# Patient Record
Sex: Female | Born: 1945 | Race: White | Hispanic: No | Marital: Married | Smoking: Never smoker
Health system: Southern US, Community
[De-identification: ages and names within clinical notes are randomized; demographics above are authoritative.]

## PROBLEM LIST (undated history)

## (undated) DIAGNOSIS — E785 Hyperlipidemia, unspecified: Secondary | ICD-10-CM

## (undated) DIAGNOSIS — R011 Cardiac murmur, unspecified: Secondary | ICD-10-CM

## (undated) HISTORY — PX: TONSILLECTOMY: SUR1361

## (undated) HISTORY — DX: Hyperlipidemia, unspecified: E78.5

## (undated) HISTORY — PX: CARPAL TUNNEL RELEASE: SHX101

## (undated) HISTORY — PX: ABDOMINAL HYSTERECTOMY: SHX81

## (undated) HISTORY — DX: Cardiac murmur, unspecified: R01.1

---

## 1998-10-19 HISTORY — PX: FINGER AMPUTATION: SHX636

## 2016-10-21 ENCOUNTER — Emergency Department (HOSPITAL_COMMUNITY)
Admission: EM | Admit: 2016-10-21 | Discharge: 2016-10-21 | Disposition: A | Discharge status: Home / Self Care | Payer: Medicare Other | Attending: Emergency Medicine | Admitting: Emergency Medicine

## 2016-10-21 ENCOUNTER — Emergency Department (HOSPITAL_COMMUNITY): Payer: Medicare Other

## 2016-10-21 ENCOUNTER — Ambulatory Visit (HOSPITAL_COMMUNITY)
Admission: EM | Admit: 2016-10-21 | Discharge: 2016-10-21 | Disposition: A | Discharge status: Home / Self Care | Payer: Medicare Other | Attending: Emergency Medicine | Admitting: Emergency Medicine

## 2016-10-21 ENCOUNTER — Encounter (HOSPITAL_COMMUNITY): Payer: Self-pay | Admitting: Emergency Medicine

## 2016-10-21 ENCOUNTER — Encounter (HOSPITAL_COMMUNITY): Payer: Self-pay | Admitting: *Deleted

## 2016-10-21 DIAGNOSIS — M542 Cervicalgia: Secondary | ICD-10-CM | POA: Diagnosis present

## 2016-10-21 DIAGNOSIS — Z7982 Long term (current) use of aspirin: Secondary | ICD-10-CM | POA: Diagnosis not present

## 2016-10-21 DIAGNOSIS — R0789 Other chest pain: Secondary | ICD-10-CM | POA: Diagnosis not present

## 2016-10-21 LAB — BASIC METABOLIC PANEL
ANION GAP: 8 (ref 5–15)
BUN: 14 mg/dL (ref 6–20)
CO2: 26 mmol/L (ref 22–32)
Calcium: 9.7 mg/dL (ref 8.9–10.3)
Chloride: 103 mmol/L (ref 101–111)
Creatinine, Ser: 0.82 mg/dL (ref 0.44–1.00)
GFR calc Af Amer: >60 mL/min (ref >60)
GFR calc non Af Amer: >60 mL/min (ref >60)
GLUCOSE: 86 mg/dL (ref 65–99)
POTASSIUM: 4 mmol/L (ref 3.5–5.1)
Sodium: 137 mmol/L (ref 135–145)

## 2016-10-21 LAB — I-STAT TROPONIN, ED
Troponin i, poc: 0.01 ng/mL (ref 0.00–0.08)
Troponin i, poc: 0 ng/mL (ref 0.00–0.08)

## 2016-10-21 LAB — CBC
HEMATOCRIT: 42.2 % (ref 36.0–46.0)
HEMOGLOBIN: 14.4 g/dL (ref 12.0–15.0)
MCH: 30.3 pg (ref 26.0–34.0)
MCHC: 34.1 g/dL (ref 30.0–36.0)
MCV: 88.8 fL (ref 78.0–100.0)
Platelets: 246 10*3/uL (ref 150–400)
RBC: 4.75 MIL/uL (ref 3.87–5.11)
RDW: 13 % (ref 11.5–15.5)
WBC: 6.1 10*3/uL (ref 4.0–10.5)

## 2016-10-21 MED ORDER — SODIUM CHLORIDE 0.9 % IV BOLUS (SEPSIS)
1000.0000 mL | Freq: Once | INTRAVENOUS | Status: AC
  Administered 2016-10-21: 1000 mL via INTRAVENOUS

## 2016-10-21 MED ORDER — IOPAMIDOL (ISOVUE-370) INJECTION 76%
INTRAVENOUS | Status: AC
  Administered 2016-10-21: 50 mL
  Filled 2016-10-21: qty 50

## 2016-10-21 MED ORDER — ASPIRIN 325 MG PO TBEC: 325.0000 mg | DELAYED_RELEASE_TABLET | Freq: Every day | ORAL | Status: AC

## 2016-10-21 NOTE — Discharge Instructions (Signed)
Increase your aspirin dose to full strength dose (325 mg) daily.   If you begin to develop any focal weakness, numbness, or other concerning signs or symptoms -- return to nearest ER immediately.   Follow-up with your primary doctor back home within the next 1-2 weeks for re-evaluation.

## 2016-10-21 NOTE — ED Triage Notes (Signed)
Pt  Reports   l  Upper   Back   And   Neck    Pain      X  3  Days   Has  Had  Similar          Had  Chest  Pain  Last     Pm    X  1 5     mins          denys  Any      Chest  Pain         Neck  Pain    Now      Pain  Comes   And  Goes

## 2016-10-21 NOTE — ED Provider Notes (Signed)
CSN: [id number]     Arrival date & time 10/21/16  1231 History   First MD Initiated Contact with Patient 10/21/16 1410     Chief Complaint  Patient presents with  . Back Pain   (Consider location/radiation/quality/duration/timing/severity/associated sxs/prior Treatment) Patient presents with c/o left posterior neck pain that is severe.  She also is having chest pain.  [facility] she developed sudden onset of posterior neck pain and when she was lying down last night she developed chest pain that comes and goes.  She is having neck pain that is severe in pain and then diminishes to lower level of pain.     The history is provided by the patient.  Back Pain  Location:  Generalized Quality:  Aching Radiates to:  L shoulder Pain severity:  Severe Pain is:  Same all the time Onset quality:  Sudden Duration:  1 day Timing:  Constant Progression:  Waxing and waning Chronicity:  New Relieved by:  Nothing Worsened by:  Nothing Ineffective treatments:  None tried   History reviewed. No pertinent past medical history. History reviewed. No pertinent surgical history. History reviewed. No pertinent family history. Social History  Substance Use Topics  . Smoking status: Never Smoker  . Smokeless tobacco: Never Used  . Alcohol use No   OB History    No data available     Review of Systems  Constitutional: Negative.   HENT: Negative.   Eyes: Negative.   Respiratory: Negative.   Cardiovascular: Negative.   Gastrointestinal: Negative.   Endocrine: Negative.   Genitourinary: Negative.   Musculoskeletal: Positive for back pain.  Skin: Negative.   Allergic/Immunologic: Negative.   Neurological: Negative.   Hematological: Negative.   Psychiatric/Behavioral: Negative.     Allergies  Codeine  Home Medications   Prior to Admission medications   Not on File   Meds Ordered and Administered this Visit  Medications - No data to  display  BP 118/67   Pulse 76   Temp 98.2 F (36.8 C) (Oral)   Resp 18   SpO2 100%  No data found.   Physical Exam  Constitutional: She is oriented to person, place, and time. She appears well-developed and well-nourished.  HENT:  Head: Normocephalic and atraumatic.  Eyes: Conjunctivae and EOM are normal. Pupils are equal, round, and reactive to light.  Neck: Normal range of motion. Neck supple.  Cardiovascular: Normal rate, regular rhythm and normal heart sounds.   Pulmonary/Chest: Effort normal and breath sounds normal.  Abdominal: Soft. Bowel sounds are normal.  Musculoskeletal: Normal range of motion.  Neurological: She is alert and oriented to person, place, and time.  Nursing note and vitals reviewed.   Urgent Care Course   Clinical Course     Procedures (including critical care time)  Labs Review Labs Reviewed - No data to display  Imaging Review No results found.   Visual Acuity Review  Right Eye Distance:   Left Eye Distance:   Bilateral Distance:    Right Eye Near:   Left Eye Near:    Bilateral Near:         MDM   1. Neck pain   2. Other chest pain    Transfer to ED to rule out cardiac etiology or concerns for possible dissecting vertebral arteries.       Deatra CanterWilliam J Korbyn Chopin, FNP 10/21/16 [phone]

## 2016-10-21 NOTE — ED Triage Notes (Signed)
[facility], left shoulder and CP intermittent x 3 days

## 2016-10-21 NOTE — ED Provider Notes (Signed)
[facility]-EMERGENCY DEPT Provider Note   CSN: [id number] Arrival date & time: 10/21/16  1515     History   Chief Complaint Chief Complaint  Patient presents with  . Neck Pain  . Chest Pain    HPI Claire Dominguez is a 71 y.o. female.  The history is provided by the patient and a relative. No language interpreter was used.     71 year old female with a past medical history of hyperlipidemia presenting today with neck pain starting this morning and chest pain starting this morning. Patient states that her neck pain is on the left side of her posterior neck. She describes it as sharp and radiating down towards the shoulder. Denies any prior similar symptoms. Denies any known injury but does state she rode a bicycle for 5 miles yesterday which she typically does on a regular basis. Patient also states that around 4 AM this morning she had 10-15 minutes of sharp squeezing chest pain. States this resolved on its own. States she has had twinges of chest pain since then but none as bad as the initial episode. States that her neck pain waxes and wanes. Currently the pain is tolerable. Was initially seen at urgent care and sent here due to concern for possible vertebral dissection or cardiac etiology. Movement does not worsen her symptoms. No alleviating symptoms.  History reviewed. No pertinent past medical history.  There are no active problems to display for this patient.   History reviewed. No pertinent surgical history.  OB History    No data available       Home Medications    Prior to Admission medications   Medication Sig Start Date End Date Taking? Authorizing Provider  aspirin EC 81 MG tablet Take 81 mg by mouth daily.   Yes Historical Provider, MD  Multiple Vitamin (MULTIVITAMIN WITH MINERALS) TABS tablet Take 1 tablet by mouth daily.   Yes Historical Provider, MD    Family History History reviewed. No pertinent family history.  Social History Social History  Substance Use  Topics  . Smoking status: Never Smoker  . Smokeless tobacco: Never Used  . Alcohol use No     Allergies   Bactrim [sulfamethoxazole-trimethoprim] and Codeine   Review of Systems Review of Systems  Constitutional: Negative for chills and fever.  HENT: Negative for ear pain and sore throat.   Eyes: Negative for pain and visual disturbance.  Respiratory: Negative for cough and shortness of breath.   Cardiovascular: Positive for chest pain (as described in HPI). Negative for palpitations.  Gastrointestinal: Positive for nausea (yesterday). Negative for abdominal pain and vomiting.  Genitourinary: Negative for dysuria and hematuria.  Musculoskeletal: Positive for neck pain (as described in HPI). Negative for arthralgias and back pain.  Skin: Negative for color change and rash.  Neurological: Negative for seizures, syncope, weakness and numbness.  All other systems reviewed and are negative.    Physical Exam Updated Vital Signs BP 113/55   Pulse 82   Temp 98 F (36.7 C) (Oral)   Resp 13   SpO2 95%   Physical Exam  Constitutional: She is oriented to person, place, and time. She appears well-developed and well-nourished. No distress.  HENT:  Head: Normocephalic and atraumatic.  Eyes: Conjunctivae and EOM are normal. Pupils are equal, round, and reactive to light.  Neck: Normal range of motion. Neck supple. No spinous process tenderness and no muscular tenderness present. Normal range of motion present.  Cardiovascular: Normal rate and regular rhythm.   Murmur (grade  II systolic murmur) heard. Pulmonary/Chest: Effort normal and breath sounds normal. No respiratory distress.  Abdominal: Soft. There is no tenderness.  Musculoskeletal: She exhibits no edema.  Neurological: She is alert and oriented to person, place, and time. She has normal strength. No cranial nerve deficit (CN II-XII intact) or sensory deficit. Coordination (nml finger to nose bilaterally) normal. GCS eye  subscore is 4. GCS verbal subscore is 5. GCS motor subscore is 6.  Skin: Skin is warm and dry.  Psychiatric: She has a normal mood and affect.  Nursing note and vitals reviewed.    ED Treatments / Results  Labs (all labs ordered are listed, but only abnormal results are displayed) Labs Reviewed  BASIC METABOLIC PANEL  CBC  I-STAT TROPOININ, ED  I-STAT TROPOININ, ED    EKG  EKG Interpretation None       Radiology Ct Angio Head W Or Wo Contrast  Result Date: 10/21/2016 CLINICAL DATA:  Initial evaluation for severe left neck pain. EXAM: CT ANGIOGRAPHY HEAD AND NECK TECHNIQUE: Multidetector CT imaging of the head and neck was performed using the standard protocol during bolus administration of intravenous contrast. Multiplanar CT image reconstructions and MIPs were obtained to evaluate the vascular anatomy. Carotid stenosis measurements (when applicable) are obtained utilizing NASCET criteria, using the distal internal carotid diameter as the denominator. CONTRAST:  50 cc of Isovue 370. COMPARISON:  None. FINDINGS: CT HEAD FINDINGS Brain: Mild age-related cerebral atrophy with chronic microvascular ischemic disease. No acute intracranial hemorrhage. No evidence for acute large vessel territory infarct. No mass lesion, midline shift or mass effect. No hydrocephalus. No extra-axial fluid collection. Vascular: No hyperdense vessel. Skull: Scalp soft tissues within normal limits.  Calvarium intact. Sinuses: Paranasal sinuses are clear.  No mastoid effusion. Orbits: Globes and orbital soft tissues within normal limits. Review of the MIP images confirms the above findings CTA NECK FINDINGS Aortic arch: Aortic arch of normal caliber with normal branch morphology. No high-grade stenosis at the origin of the great vessels. Minimal plaque within the arch itself. Visualized subclavian arteries widely patent. Right carotid system: Right common carotid artery patent from its origin to the bifurcation.  Minimal centric plaque about the proximal right ICA without stenosis. Right ICA patent distally to the skullbase without stenosis, dissection, or occlusion. Left carotid system: Left common carotid artery patent from its origin to the bifurcation. A centric calcified plaque about the left bifurcation/ proximal left ICA. No significant flow-limiting stenosis. Distally, the left ICA demonstrates multifocal irregularity, concerning for possible non flow limiting dissection (series 8, image 115). Area of involvement measures approximately 15 mm in length. No raised intimal flap, intraluminal thrombus, or significant stenosis identified. While FMD could conceivably have this appearance, finding is concerning for dissection given the patient's symptoms and isolated nature of this finding. Vertebral arteries: Both vertebral arteries arise from the subclavian arteries. Vertebral arteries widely patent within the neck without stenosis, dissection, or occlusion. Minimal irregularity within the distal V2 segments favored to be artifactual in nature on this exam. Skeleton: No acute osseous abnormality. No worrisome lytic or blastic osseous lesions. Other neck: Soft tissues of the neck demonstrate no acute abnormality. 11 mm hypodense nodule noted within the right thyroid lobe, of doubtful clinical significance. Upper chest: Visualized upper mediastinum within normal limits. Visualized lungs are clear. Review of the MIP images confirms the above findings CTA HEAD FINDINGS Anterior circulation: Petrous segments patent bilaterally. Mild scattered plaque within the and cavernous ICAs without significant stenosis. Tiny 2 mm focal  outpouching arising from the cavernous right ICA felt to reflect date small infundibulum, with small arterial branch seen coursing from its apex (series 7, image 113). This is directed laterally. ICA termini widely patent. A1 segments patent. Anterior communicating artery normal. Anterior cerebral arteries  patent to their distal aspects. M1 segments widely patent without stenosis or occlusion. MCA bifurcations normal. No proximal M2 occlusion. Distal MCA branches well opacified and symmetric bilaterally. Posterior circulation: Vertebral arteries patent to the vertebrobasilar junction. Posterior inferior cerebral arteries patent bilaterally. Basilar artery widely patent. Superior cerebellar arteries patent bilaterally. Both of the posterior cerebral artery supplied via the basilar artery and are well opacified to their distal aspects. Venous sinuses: Patent. Anatomic variants: No significant anatomic variant. No discrete aneurysm or vascular malformation. Delayed phase: No pathologic enhancement. Review of the MIP images confirms the above findings IMPRESSION: 1. 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 No raised intimal flap, intraluminal thrombus, or significant stenosis identified. While FMD could could conceivably have this appearance, this is less favored given the patient's symptoms and isolated nature of this finding. 2. No other acute vascular abnormality within the major arterial vasculature of the head and neck. 3. Atheromatous plaque about the carotid bifurcations without significant stenosis, left greater than right. Electronically Signed   By: Rise Mu M.D.   On: 10/21/2016 22:18   Dg Chest 2 View  Result Date: 10/21/2016 CLINICAL DATA:  Chest pain for 3 days EXAM: CHEST  2 VIEW COMPARISON:  None. FINDINGS: Lungs are clear. Heart size and pulmonary vascularity are normal. No adenopathy. There is atherosclerotic calcification in the aorta. There is upper thoracic dextroscoliosis with mild lower thoracic levoscoliosis. No pneumothorax. IMPRESSION: No edema or consolidation.  There is aortic atherosclerosis. Electronically Signed   By: Bretta Bang III M.D.   On: 10/21/2016 16:32   Ct Angio Neck W And/or Wo  Contrast  Result Date: 10/21/2016 CLINICAL DATA:  Initial evaluation for severe left neck pain. EXAM: CT ANGIOGRAPHY HEAD AND NECK TECHNIQUE: Multidetector CT imaging of the head and neck was performed using the standard protocol during bolus administration of intravenous contrast. Multiplanar CT image reconstructions and MIPs were obtained to evaluate the vascular anatomy. Carotid stenosis measurements (when applicable) are obtained utilizing NASCET criteria, using the distal internal carotid diameter as the denominator. CONTRAST:  50 cc of Isovue 370. COMPARISON:  None. FINDINGS: CT HEAD FINDINGS Brain: Mild age-related cerebral atrophy with chronic microvascular ischemic disease. No acute intracranial hemorrhage. No evidence for acute large vessel territory infarct. No mass lesion, midline shift or mass effect. No hydrocephalus. No extra-axial fluid collection. Vascular: No hyperdense vessel. Skull: Scalp soft tissues within normal limits.  Calvarium intact. Sinuses: Paranasal sinuses are clear.  No mastoid effusion. Orbits: Globes and orbital soft tissues within normal limits. Review of the MIP images confirms the above findings CTA NECK FINDINGS Aortic arch: Aortic arch of normal caliber with normal branch morphology. No high-grade stenosis at the origin of the great vessels. Minimal plaque within the arch itself. Visualized subclavian arteries widely patent. Right carotid system: Right common carotid artery patent from its origin to the bifurcation. Minimal centric plaque about the proximal right ICA without stenosis. Right ICA patent distally to the skullbase without stenosis, dissection, or occlusion. Left carotid system: Left common carotid artery patent from its origin to the bifurcation. A centric calcified plaque about the left bifurcation/ proximal left ICA. No significant flow-limiting stenosis. Distally, the left ICA demonstrates multifocal  irregularity, concerning for possible non flow limiting  dissection (series 8, image 115). Area of involvement measures approximately 15 mm in length. No raised intimal flap, intraluminal thrombus, or significant stenosis identified. While FMD could conceivably have this appearance, finding is concerning for dissection given the patient's symptoms and isolated nature of this finding. Vertebral arteries: Both vertebral arteries arise from the subclavian arteries. Vertebral arteries widely patent within the neck without stenosis, dissection, or occlusion. Minimal irregularity within the distal V2 segments favored to be artifactual in nature on this exam. Skeleton: No acute osseous abnormality. No worrisome lytic or blastic osseous lesions. Other neck: Soft tissues of the neck demonstrate no acute abnormality. 11 mm hypodense nodule noted within the right thyroid lobe, of doubtful clinical significance. Upper chest: Visualized upper mediastinum within normal limits. Visualized lungs are clear. Review of the MIP images confirms the above findings CTA HEAD FINDINGS Anterior circulation: Petrous segments patent bilaterally. Mild scattered plaque within the and cavernous ICAs without significant stenosis. Tiny 2 mm focal outpouching arising from the cavernous right ICA felt to reflect date small infundibulum, with small arterial branch seen coursing from its apex (series 7, image 113). This is directed laterally. ICA termini widely patent. A1 segments patent. Anterior communicating artery normal. Anterior cerebral arteries patent to their distal aspects. M1 segments widely patent without stenosis or occlusion. MCA bifurcations normal. No proximal M2 occlusion. Distal MCA branches well opacified and symmetric bilaterally. Posterior circulation: Vertebral arteries patent to the vertebrobasilar junction. Posterior inferior cerebral arteries patent bilaterally. Basilar artery widely patent. Superior cerebellar arteries patent bilaterally. Both of the posterior cerebral artery  supplied via the basilar artery and are well opacified to their distal aspects. Venous sinuses: Patent. Anatomic variants: No significant anatomic variant. No discrete aneurysm or vascular malformation. Delayed phase: No pathologic enhancement. Review of the MIP images confirms the above findings IMPRESSION: 1. 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 No raised intimal flap, intraluminal thrombus, or significant stenosis identified. While FMD could could conceivably have this appearance, this is less favored given the patient's symptoms and isolated nature of this finding. 2. No other acute vascular abnormality within the major arterial vasculature of the head and neck. 3. Atheromatous plaque about the carotid bifurcations without significant stenosis, left greater than right. Electronically Signed   By: Rise Mu M.D.   On: 10/21/2016 22:18    Procedures Procedures (including critical care time)  Medications Ordered in ED Medications  sodium chloride 0.9 % bolus 1,000 mL (1,000 mLs Intravenous New Bag/Given 10/21/16 2131)  iopamidol (ISOVUE-370) 76 % injection (50 mLs  Contrast Given 10/21/16 2103)     Initial Impression / Assessment and Plan / ED Course  I have reviewed the triage vital signs and the nursing notes.  Pertinent labs & imaging results that were available during my care of the patient were reviewed by me and considered in my medical decision making (see chart for details).  Clinical Course     71 year old female presents today with neck pain and chest pain today. On arrival, patient's pain is tolerable. She has no focal neurological deficits. No ischemic changes on EKG. Initial troponin is negative. Heart score is 3 as I have low suspicion given her description of the symptoms today for ACS.  Proceeded with CTA of the head and neck. On review of these imaging studies, patient has a area on the left  internal carotid artery that is irregular and could possibly be a  dissection. Neurology paged and we are currently awaiting recommendations at 11 PM.  2317: Discussed with neurologist Dr. Otelia LimesLindzen, at this point recommends continuing aspirin at home. Does not think this is related to pain. Would not put on anticoagulation at this point.     At this point, given negative troponin and imaging feel patient would be appropriate for discharge home on aspirin (med list reveals she is already on this). I discussed this plan with the patient who is agreeable. Patient discharged home in good condition.   Final Clinical Impressions(s) / ED Diagnoses   Final diagnoses:  Neck pain  Atypical chest pain    New Prescriptions New Prescriptions   No medications on file     Madolyn FriezeVijay Alsace Dowd, MD 10/21/16 [id number]    Madolyn FriezeVijay Kariss Longmire, MD 10/21/16 [id number]    Lavera Guiseana Duo Liu, MD 10/23/16 1303

## 2016-10-21 NOTE — ED Notes (Signed)
Patient transported to CT 

## 2016-11-06 ENCOUNTER — Ambulatory Visit (INDEPENDENT_AMBULATORY_CARE_PROVIDER_SITE_OTHER): Payer: Medicare Other | Admitting: Vascular Surgery

## 2016-11-06 ENCOUNTER — Encounter: Payer: Self-pay | Admitting: Vascular Surgery

## 2016-11-06 VITALS — BP 123/76 | HR 78 | Temp 97.7°F | Resp 18 | Ht 60.0 in | Wt 141.5 lb

## 2016-11-06 DIAGNOSIS — I739 Peripheral vascular disease, unspecified: Principal | ICD-10-CM

## 2016-11-06 DIAGNOSIS — I779 Disorder of arteries and arterioles, unspecified: Secondary | ICD-10-CM

## 2016-11-06 NOTE — Progress Notes (Signed)
New Carotid Patient  Referred by:  Dr. Docia Chuck  Reason for referral: left internal carotid artery dissection  History of Present Illness  Claire Dominguez is a 71 y.o. (December 04, 1945) female who presents with chief complaint: previous episode of neck pain.  On 10/20/16 this patient had onset of severe neck and then chest pain.  Neck pain was sharp in character with severe intensity.  This occurred in the setting of exercise at the [facility].  She then developed chest pain which had some element of radiation to shoulder.  She felt the neck pain improved with warm compress and the chest pain resolved.  The next morning she continued to have some neck pain, so she was referred to the urgent care center, which felt that she might have a dissection component so she was referred to the ED.  CTA neck was done there and read as an equivocal L ICA dissection.  This patient has no history of TIA or CVA sx.  Her risk factors for atherosclerosis include: HLD.  Past Medical History:  Diagnosis Date  . Heart murmur   . Hyperlipidemia     Past Surgical History:  Procedure Laterality Date  . ABDOMINAL HYSTERECTOMY    . CARPAL TUNNEL RELEASE Bilateral   . CESAREAN SECTION    . FINGER AMPUTATION Right 2000   third finger   . TONSILLECTOMY      Social History   Social History  . Marital status: Married    Spouse name: N/A  . Number of children: N/A  . Years of education: N/A   Occupational History  . Not on file.   Social History Main Topics  . Smoking status: Never Smoker  . Smokeless tobacco: Never Used  . Alcohol use No  . Drug use: No  . Sexual activity: Not on file   Other Topics Concern  . Not on file   Social History Narrative  . No narrative on file    Family History: patient is unable to detail the medical history of his parents   Current Outpatient Prescriptions  Medication Sig Dispense Refill  . aspirin EC 325 MG EC tablet Take 1 tablet (325 mg total) by mouth daily.    .  Multiple Vitamin (MULTIVITAMIN WITH MINERALS) TABS tablet Take 1 tablet by mouth daily.     No current facility-administered medications for this visit.     Allergies  Allergen Reactions  . Bactrim [Sulfamethoxazole-Trimethoprim]     unknown  . Codeine     Rooms spin around     REVIEW OF SYSTEMS:   Cardiac:  positive for: Chest pain or chest pressure, negative for: Shortness of breath upon exertion and Shortness of breath when lying flat,   Vascular:  positive for: no symptoms,  negative for: Pain in calf, thigh, or hip brought on by ambulation, Pain in feet at night that wakes you up from your sleep, Blood clot in your veins and Leg swelling  Pulmonary:  positive for: no symptoms,  negative for: Oxygen at home, Productive cough and Wheezing  Neurologic:  positive for: No symptoms, negative for: Sudden weakness in arms or legs, Sudden numbness in arms or legs, Sudden onset of difficulty speaking or slurred speech, Temporary loss of vision in one eye and Problems with dizziness  Gastrointestinal:  positive for: no symptoms, negative for: Blood in stool and Vomited blood  Genitourinary:  positive for: no symptoms, negative for: Burning when urinating and Blood in urine  Psychiatric:  positive for:  no symptoms,  negative for: Major depression  Hematologic:  positive for: no symptoms,  negative for: negative for: Bleeding problems and Problems with blood clotting too easily  Dermatologic:  positive for: Rashes or ulcers, negative for: skin cancer  Constitutional:  positive for: no symptoms, negative for: Fever or chills  Ear/Nose/Throat:  positive for: no symptoms, negative for: Change in hearing, Nose bleeds and Sore throat  Musculoskeletal:  positive for: no symptoms, negative for: Back pain, Joint pain and Muscle pain   For VQI Use Only  PRE-ADM LIVING: Home  AMB STATUS: Ambulatory  CAD Sx: None  PRIOR CHF: None  STRESS TEST: No   Physical  Examination  Vitals:   11/06/16 1411 11/06/16 1416  BP: 120/73 123/76  Pulse: 78   Resp: 18   Temp: 97.7 F (36.5 C)   TempSrc: Oral   SpO2: 99%   Weight: 141 lb 8 oz (64.2 kg)   Height: 5' (1.524 m)     Body mass index is 27.63 kg/m.  General: Alert, O x 3, WD,NAD  Head: [place]/AT,   Ear/Nose/Throat: Hearing grossly intact, nares without erythema or drainage, oropharynx without Erythema or Exudate , Mallampati score: 3, Dentition intact  Eyes: PERRLA, EOMI,   Neck: Supple, mid-line trachea,    Pulmonary: Sym exp, good B air movt,CTA B  Cardiac: RRR, Nl S1, S2, no Murmurs, No rubs, No S3,S4  Vascular: Vessel Right Left  Radial Palpable Palpable  Brachial Palpable Palpable  Carotid Palpable, No Bruit Palpable, No Bruit  Aorta Not palpable N/A  Femoral Palpable Palpable  Popliteal Not palpable Not palpable  PT Palpable Palpable  DP Palpable Palpable   Gastrointestinal: soft, non-distended, non-tender to palpation, No guarding or rebound, no HSM, no masses, no CVAT B, No palpable prominent aortic pulse,    Musculoskeletal: M/S 5/5 throughout, Extremities without ischemic changes  Neurologic: CN 2-12 intact, Pain and light touch intact in extremities, Motor exam as listed above  Psychiatric: Judgement intact , Mood & affect appropriate for pt's clinical situation  Dermatologic: See M/S exam for extremity exam, No rashes otherwise noted  Lymph : Palpable lymph nodes: None   CTA Neck (10/21/16) 1. 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 No raised intimal flap, intraluminal thrombus, or significant stenosis identified. While FMD could could conceivably have this appearance, this is less favored given the patient's symptoms and isolated nature of this finding. 2. No other acute vascular abnormality within the major arterial vasculature of the head and neck. 3. Atheromatous plaque about the carotid  bifurcations without significant stenosis, left greater than right.  I reviewed the patient neck CTA and there is NO evidence of dissection in either  ICA.  There is some calcific plaque in the L ICA which results in a <50% stenosis.  There is minimal calcific plaque in the R ICA There is nothing that would account for the neck or chest pain.   Medical Decision Making  Claire ComfortBarbara Dominguez is a 71 y.o. female who presents with: asx L ICA stenoses < 50%, minimal disease in R ICA   I doubt any carotid dissection in this patient.  Her hx is not consistent with such and her imaging is not consistent with such.  Her imaging is consistent with atherosclerotic plaque in the LICA but this would not account for her neck and chest pain.  I discussed in depth with the patient the nature of atherosclerosis, and emphasized the importance of maximal  medical management including strict control of blood pressure, blood glucose, and lipid levels, obtaining regular exercise, antiplatelet agents, and cessation of smoking.    The patient reported has elevated cholesterol so she needs a lipid panel obtain to see if she would benefit from a statin.  I agree that she should continue with ASA 81 mg PO daily.  The patient is aware that without maximal medical management the underlying atherosclerotic disease process will progress, limiting the benefit of any interventions.  Thank you for allowing [place] to participate in this patient's care.   Leonides Sake, MD, FACS [facility] Office: [phone] Pager: [phone]  11/06/2016, 3:09 PM

## 2017-12-10 IMAGING — CT CT ANGIO NECK
1 of 12 series · 5 of 33 positions shown · IV contrast (isovue)
Comparison: None.

CLINICAL DATA: Initial evaluation for severe left neck pain.

EXAM:
CT ANGIOGRAPHY HEAD AND NECK
TECHNIQUE: Multidetector CT imaging of the head and neck was performed using
the standard protocol during bolus administration of intravenous
contrast. Multiplanar CT image reconstructions and MIPs were
obtained to evaluate the vascular anatomy. Carotid stenosis
measurements (when applicable) are obtained utilizing NASCET
criteria, using the distal internal carotid diameter as the
denominator.
CONTRAST:  50 cc of Isovue 370.

[Series 7: cta neck axial · axial · 0.39mm/px · z∈[-234,-10]mm · 5 of 338 slices shown]
[im 57/338  soft-tissue]
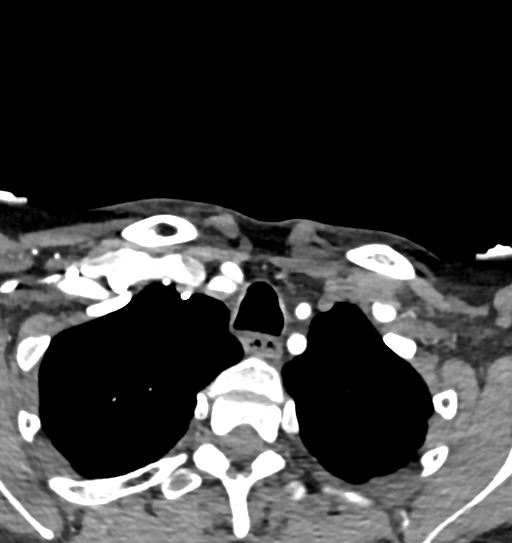
[im 113/338  bone]
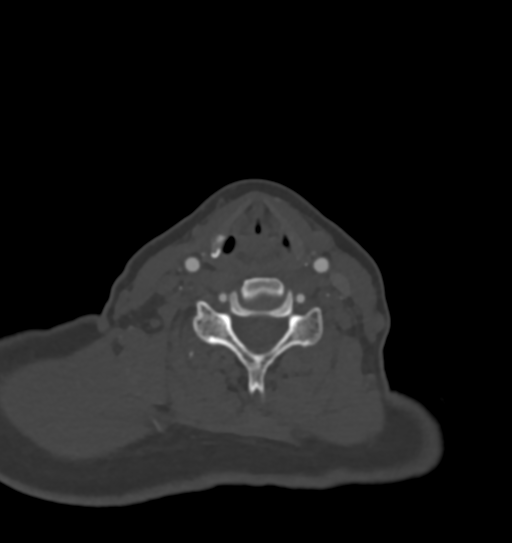
[im 169/338  soft-tissue]
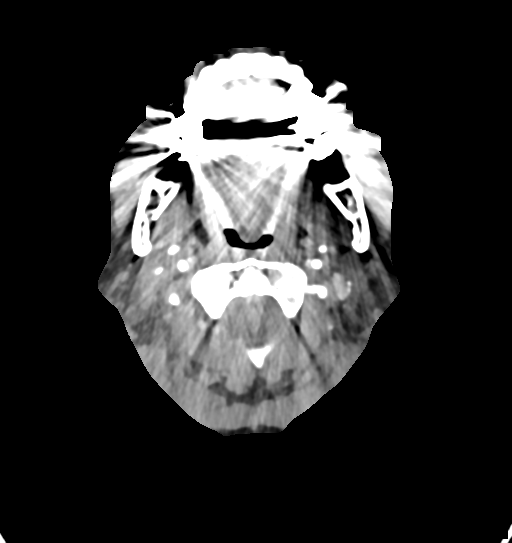
[im 225/338  bone]
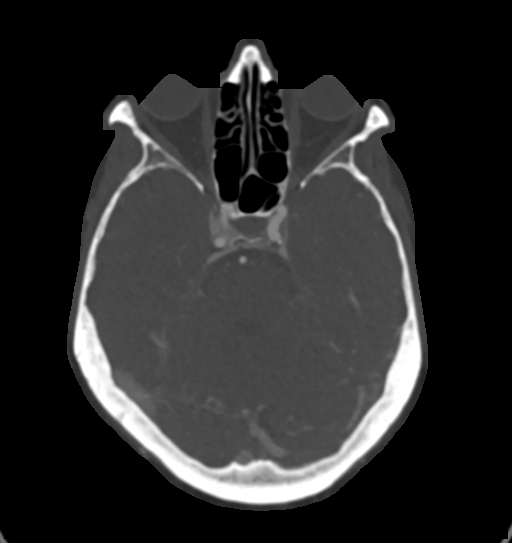
[im 281/338  soft-tissue]
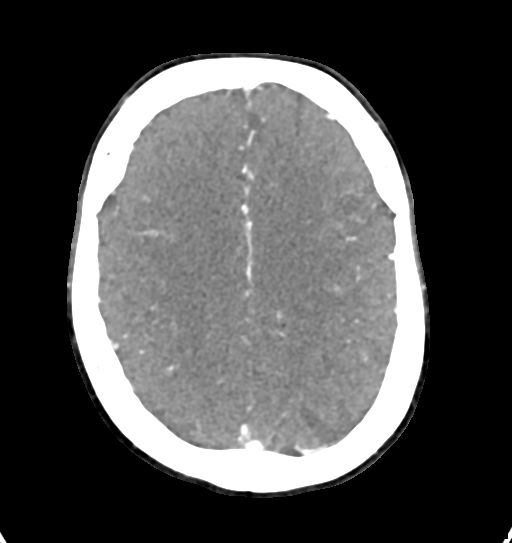

[5 of 33 positions shown; findings below may reference images not displayed]

FINDINGS: CT HEAD FINDINGS

Brain: Mild age-related cerebral atrophy with chronic microvascular
ischemic disease. No acute intracranial hemorrhage. No evidence for
acute large vessel territory infarct. No mass lesion, midline shift
or mass effect. No hydrocephalus. No extra-axial fluid collection.

Vascular: No hyperdense vessel.

Skull: Scalp soft tissues within normal limits.  Calvarium intact.

Sinuses: Paranasal sinuses are clear.  No mastoid effusion.

Orbits: Globes and orbital soft tissues within normal limits.

Review of the MIP images confirms the above findings

CTA NECK FINDINGS

Aortic arch: Aortic arch of normal caliber with normal branch
morphology. No high-grade stenosis at the origin of the great
vessels. Minimal plaque within the arch itself. Visualized
subclavian arteries widely patent.

Right carotid system: Right common carotid artery patent from its
origin to the bifurcation. Minimal centric plaque about the proximal
right ICA without stenosis. Right ICA patent distally to the
skullbase without stenosis, dissection, or occlusion.

Left carotid system: Left common carotid artery patent from its
origin to the bifurcation. A centric calcified plaque about the left
bifurcation/ proximal left ICA. No significant flow-limiting
stenosis. Distally, the left ICA demonstrates multifocal
irregularity, concerning for possible non flow limiting dissection
(series 8, image 115). Area of involvement measures approximately 15
mm in length. No raised intimal flap, intraluminal thrombus, or
significant stenosis identified. While FMD could conceivably have
this appearance, finding is concerning for dissection given the
patient's symptoms and isolated nature of this finding.

Vertebral arteries: Both vertebral arteries arise from the
subclavian arteries. Vertebral arteries widely patent within the
neck without stenosis, dissection, or occlusion. Minimal
irregularity within the distal V2 segments favored to be artifactual
in nature on this exam.

Skeleton: No acute osseous abnormality. No worrisome lytic or
blastic osseous lesions.

Other neck: Soft tissues of the neck demonstrate no acute
abnormality. 11 mm hypodense nodule noted within the right thyroid
lobe, of doubtful clinical significance.

Upper chest: Visualized upper mediastinum within normal limits.
Visualized lungs are clear.

Review of the MIP images confirms the above findings

CTA HEAD FINDINGS

Anterior circulation: Petrous segments patent bilaterally. Mild
scattered plaque within the and cavernous ICAs without significant
stenosis. Tiny 2 mm focal outpouching arising from the cavernous
right ICA felt to reflect date small infundibulum, with small
arterial branch seen coursing from its apex (series 7, image 113).
This is directed laterally. ICA termini widely patent. A1 segments
patent. Anterior communicating artery normal. Anterior cerebral
arteries patent to their distal aspects. M1 segments widely patent
without stenosis or occlusion. MCA bifurcations normal. No proximal
M2 occlusion. Distal MCA branches well opacified and symmetric
bilaterally.

Posterior circulation: Vertebral arteries patent to the
vertebrobasilar junction. Posterior inferior cerebral arteries
patent bilaterally. Basilar artery widely patent. Superior
cerebellar arteries patent bilaterally. Both of the posterior
cerebral artery supplied via the basilar artery and are well
opacified to their distal aspects.

Venous sinuses: Patent.

Anatomic variants: No significant anatomic variant. No discrete
aneurysm or vascular malformation.

Delayed phase: No pathologic enhancement.

Review of the MIP images confirms the above findings
IMPRESSION: 1. Multifocal irregularity involving the distal left ICA as above,
concerning for non flow-limiting dissection. No raised intimal flap,
intraluminal thrombus, or significant stenosis identified. While FMD
could could conceivably have this appearance, this is less favored
given the patient's symptoms and isolated nature of this finding.
2. No other acute vascular abnormality within the major arterial
vasculature of the head and neck.
3. Atheromatous plaque about the carotid bifurcations without
significant stenosis, left greater than right.

## 2020-08-20 ENCOUNTER — Other Ambulatory Visit (HOSPITAL_COMMUNITY): Payer: Self-pay | Admitting: Oncology

## 2020-08-20 ENCOUNTER — Telehealth (HOSPITAL_COMMUNITY): Payer: Self-pay | Admitting: Oncology

## 2020-08-20 DIAGNOSIS — U071 COVID-19: Secondary | ICD-10-CM

## 2020-08-20 NOTE — Telephone Encounter (Signed)
I connected by phone with  Claire Dominguez to discuss the potential use of an new treatment for mild to moderate COVID-19 viral infection in non-hospitalized patients.   This patient is a age/sex that meets the FDA criteria for Emergency Use Authorization of casirivimab\imdevimab.  Has a (+) direct SARS-CoV-2 viral test result 1. Has mild or moderate COVID-19  2. Is ? 74 years of age and weighs ? 40 kg 3. Is NOT hospitalized due to COVID-19 4. Is NOT requiring oxygen therapy or requiring an increase in baseline oxygen flow rate due to COVID-19 5. Is within 10 days of symptom onset 6. Has at least one of the high risk factor(s) for progression to severe COVID-19 and/or hospitalization as defined in EUA. Specific high risk criteria : Past Medical History:  Diagnosis Date  . Heart murmur   . Hyperlipidemia   ?   Symptom onset  08/16/20   I was able to touch base with patient's daughter and [name] who is currently in [place].  They are planning on bringing her to [place] in the next 24 hours.  Patient's daughter was given the hotline number as well as my work cell phone number to let us know when their mother might be able to be seen and get an appointment.  Durenda Hurt, NP 08/20/2020 2:06 PM

## 2020-08-20 NOTE — Progress Notes (Signed)
I connected by phone with  Claire Dominguez to discuss the potential use of an new treatment for mild to moderate COVID-19 viral infection in non-hospitalized patients.   This patient is a age/sex that meets the FDA criteria for Emergency Use Authorization of casirivimab\imdevimab.  Has a (+) direct SARS-CoV-2 viral test result 1. Has mild or moderate COVID-19  2. Is ? 74 years of age and weighs ? 40 kg 3. Is NOT hospitalized due to COVID-19 4. Is NOT requiring oxygen therapy or requiring an increase in baseline oxygen flow rate due to COVID-19 5. Is within 10 days of symptom onset 6. Has at least one of the high risk factor(s) for progression to severe COVID-19 and/or hospitalization as defined in EUA. Specific high risk criteria : Past Medical History:  Diagnosis Date  . Heart murmur   . Hyperlipidemia   ?  ?    Symptom onset  08/18/20   I have spoken and communicated the following to the patient or parent/caregiver:   1. FDA has authorized the emergency use of casirivimab\imdevimab for the treatment of mild to moderate COVID-19 in adults and pediatric patients with positive results of direct SARS-CoV-2 viral testing who are 50 years of age and older weighing at least 40 kg, and who are at high risk for progressing to severe COVID-19 and/or hospitalization.   2. The significant known and potential risks and benefits of casirivimab\imdevimab, and the extent to which such potential risks and benefits are unknown.   3. Information on available alternative treatments and the risks and benefits of those alternatives, including clinical trials.   4. Patients treated with casirivimab\imdevimab should continue to self-isolate and use infection control measures (e.g., wear mask, isolate, social distance, avoid sharing personal items, clean and disinfect "high touch" surfaces, and frequent handwashing) according to CDC guidelines.    5. The patient or parent/caregiver has the option to accept or  refuse casirivimab\imdevimab .   After reviewing this information with the patient, The patient agreed to proceed with receiving casirivimab\imdevimab infusion and will be provided a copy of the Fact sheet prior to receiving the infusion.Mignon Pine, AGNP-C [phone] (Infusion Center Hotline)

## 2020-08-21 ENCOUNTER — Telehealth: Payer: Self-pay | Admitting: Oncology

## 2020-08-21 NOTE — Telephone Encounter (Signed)
Re: Mab infusion  Patient's daughter called me back on 11-21 after she discussed with her brother about possibly driving their mom to [place] to get the monoclonal antibody infusion.  They were able to get the Mab infusion local in [place].  It is scheduled for Friday.  If something changes she will give [place] a call.  Durenda Hurt, NP 08/21/2020 12:22 PM

## 2021-06-06 NOTE — Telephone Encounter (Signed)
error
